# Patient Record
Sex: Male | Born: 1995 | Race: Black or African American | Hispanic: No | Marital: Single | Smoking: Never smoker
Health system: Southern US, Community
[De-identification: ages and names within clinical notes are randomized; demographics above are authoritative.]

## PROBLEM LIST (undated history)

## (undated) DIAGNOSIS — F99 Mental disorder, not otherwise specified: Secondary | ICD-10-CM

## (undated) DIAGNOSIS — R011 Cardiac murmur, unspecified: Secondary | ICD-10-CM

## (undated) DIAGNOSIS — J45909 Unspecified asthma, uncomplicated: Secondary | ICD-10-CM

## (undated) HISTORY — PX: TONSILLECTOMY: SUR1361

---
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
## 2013-01-12 ENCOUNTER — Ambulatory Visit: Payer: Medicaid Other | Admitting: Family Medicine

## 2013-01-29 ENCOUNTER — Ambulatory Visit (INDEPENDENT_AMBULATORY_CARE_PROVIDER_SITE_OTHER): Payer: Medicaid Other | Admitting: Family Medicine

## 2013-01-29 ENCOUNTER — Other Ambulatory Visit (HOSPITAL_COMMUNITY)
Admission: RE | Admit: 2013-01-29 | Discharge: 2013-01-29 | Disposition: A | Discharge status: Home / Self Care | Payer: Medicaid Other | Source: Ambulatory Visit | Attending: Family Medicine | Admitting: Family Medicine

## 2013-01-29 ENCOUNTER — Encounter: Payer: Self-pay | Admitting: Family Medicine

## 2013-01-29 VITALS — BP 128/87 | HR 74 | Temp 98.2°F | Ht 61.0 in | Wt 136.0 lb

## 2013-01-29 DIAGNOSIS — J45990 Exercise induced bronchospasm: Secondary | ICD-10-CM

## 2013-01-29 DIAGNOSIS — Z00129 Encounter for routine child health examination without abnormal findings: Secondary | ICD-10-CM

## 2013-01-29 DIAGNOSIS — Z0000 Encounter for general adult medical examination without abnormal findings: Secondary | ICD-10-CM

## 2013-01-29 DIAGNOSIS — Z113 Encounter for screening for infections with a predominantly sexual mode of transmission: Secondary | ICD-10-CM | POA: Insufficient documentation

## 2013-01-29 DIAGNOSIS — R079 Chest pain, unspecified: Secondary | ICD-10-CM

## 2013-01-29 DIAGNOSIS — H5213 Myopia, bilateral: Secondary | ICD-10-CM

## 2013-01-29 DIAGNOSIS — H5210 Myopia, unspecified eye: Secondary | ICD-10-CM

## 2013-01-29 DIAGNOSIS — H579 Unspecified disorder of eye and adnexa: Secondary | ICD-10-CM

## 2013-01-29 DIAGNOSIS — Z7251 High risk heterosexual behavior: Secondary | ICD-10-CM

## 2013-01-29 DIAGNOSIS — Z23 Encounter for immunization: Secondary | ICD-10-CM

## 2013-01-29 MED ORDER — ALBUTEROL SULFATE HFA 108 (90 BASE) MCG/ACT IN AERS: 2.0000 | INHALATION_SPRAY | Freq: Four times a day (QID) | RESPIRATORY_TRACT | Status: DC | PRN

## 2013-01-29 NOTE — Assessment & Plan Note (Signed)
Immunizations given today include MMR, Varicella, Hep A, HPV step 1.

## 2013-01-29 NOTE — Assessment & Plan Note (Signed)
Check urine GC, chlamydia and HIV antibody given age and risk factors for STI.

## 2013-01-29 NOTE — Assessment & Plan Note (Signed)
Given chest pain that is central and only with exercise in a patient with history of asthma, likely asthma related asthma. No signs of cardiac problems on physical. Restart albuterol PRN with exercise and reassess.

## 2013-01-29 NOTE — Patient Instructions (Signed)
Dear Jason Gregory,   Thank you for coming to clinic today. Please read below regarding the issues that we discussed.   1. Chest Pain - Use your inhaler as needed because this could be related to asthma.   2. Labs - I will call you with the results.   3. Vision - I will put in a referral for an eye doctor. You must go to the appointment.   Please follow up in clinic in 1 year. Please call earlier if you have any questions or concerns.   Sincerely,   Dr. Clinton Sawyer

## 2013-01-29 NOTE — Progress Notes (Signed)
Subjective:    Patient ID: Jason Gregory, male    DOB: 1996-06-27, 17 y.o.   MRN: [id number]  HPI  17 year old M presenting for well child check. Has concerns about chest pain.   Chest Pain - Centrally located, occurs whenever doing something active, relieved by rest, does not occur at rest, has history of asthma but has not used albuterol in > 1 year, denies wheezing   Home: lives with mother Jason Gregory, is also my patient), mother is supportive, also has 3 older siblings who live in [place] who he spends time with Education: Not in school, would like to go to college Activities: no organized activity Drugs/Alcohol: denies Sex: sexually active with one partner, uses condoms occasionally, has history of multiple partners with whom he did not use protection, does not want children at this time Suicide/Stress: describes his stress level as medium right now, stressed increased by legal troubles, stress relieved by spending time with his family, denies suicidal and homicidal thoughts    Past Medical History  Diagnosis Date  . Psychiatric diagnosis   . Asthma   . Heart murmur    Past Surgical History  Procedure Laterality Date  . Tonsillectomy     History   Social History  . Marital Status: Single    Spouse Name: N/A    Number of Children: N/A  . Years of Education: N/A   Occupational History  . Not on file.   Social History Main Topics  . Smoking status: Never Smoker   . Smokeless tobacco: Never Used  . Alcohol Use: No  . Drug Use: No  . Sexually Active: Not on file   Other Topics Concern  . Not on file   Social History Narrative  . No narrative on file     Current Outpatient Prescriptions on File Prior to Visit  Medication Sig Dispense Refill  . diclofenac (VOLTAREN) 75 MG EC tablet Take 1 tablet (75 mg total) by mouth 2 (two) times daily as needed (pain).  60 tablet  0  . meloxicam (MOBIC) 15 MG tablet Take 1 tablet (15 mg total) by mouth daily.  30  tablet  0  . [DISCONTINUED] albuterol (PROVENTIL,VENTOLIN) 90 MCG/ACT inhaler Inhale 2 puffs into the lungs every 4 (four) hours.         No current facility-administered medications on file prior to visit.       Review of Systems  Constitutional: Positive for appetite change. Negative for activity change and unexpected weight change.  HENT: Negative.   Eyes: Positive for visual disturbance.  Respiratory: Positive for chest tightness and shortness of breath.   Cardiovascular: Negative.   Gastrointestinal: Negative.   Endocrine: Negative.   Genitourinary: Negative.   Musculoskeletal: Negative.   Skin: Negative.   Neurological: Negative.   Psychiatric/Behavioral: Negative for suicidal ideas. The patient is nervous/anxious.        Objective:   Physical Exam  Constitutional: He is oriented to person, place, and time. He appears well-developed and well-nourished. No distress.  HENT:  Head: Normocephalic and atraumatic.  Eyes: Pupils are equal, round, and reactive to light.  Neck: Normal range of motion. Neck supple.  Cardiovascular: Normal rate and regular rhythm.   No murmur heard. Pulmonary/Chest: Effort normal and breath sounds normal.  Abdominal: Soft. Bowel sounds are normal.  Musculoskeletal: He exhibits no edema and no tenderness.  Police tracking bracelet on left ankle   Neurological: He is alert and oriented to person, place, and time.  Skin: He is not diaphoretic.  Psychiatric: He has a normal mood and affect.    BP 128/87  Pulse 74  Temp(Src) 98.2 F (36.8 C) (Oral)  Ht 5\' 1"  (1.549 m)  Wt 136 lb (61.689 kg)  BMI 25.71 kg/m2       Assessment & Plan:  Wellness visit and asthma follow up.

## 2013-01-29 NOTE — Assessment & Plan Note (Signed)
Referral made to optometry for evaluation.

## 2013-01-30 ENCOUNTER — Telehealth: Payer: Self-pay | Admitting: Family Medicine

## 2013-01-30 ENCOUNTER — Other Ambulatory Visit: Payer: Self-pay | Admitting: Family Medicine

## 2013-01-30 NOTE — Telephone Encounter (Signed)
Called to discuss lab results. No answer. No message left.

## 2013-01-31 ENCOUNTER — Telehealth: Payer: Self-pay | Admitting: Family Medicine

## 2013-01-31 NOTE — Telephone Encounter (Signed)
Please call the patient and let him know that he tested positive for chlamydia. He needs to come to clinic for a nursing visit. He should also be inform his partner that she needs treatment. Preferred treatment is azithromycin 1 g PO once.

## 2013-01-31 NOTE — Telephone Encounter (Signed)
LMOM advising pt to rt our call.

## 2013-02-01 NOTE — Telephone Encounter (Signed)
Left another message advising patient to return call today. He needs a nurse visit for positive chlamydia test.Jason Gregory, Jason Gregory

## 2013-02-06 ENCOUNTER — Telehealth: Payer: Self-pay | Admitting: *Deleted

## 2013-02-06 ENCOUNTER — Encounter: Payer: Self-pay | Admitting: Family Medicine

## 2013-02-06 DIAGNOSIS — A749 Chlamydial infection, unspecified: Secondary | ICD-10-CM | POA: Insufficient documentation

## 2013-02-06 NOTE — Telephone Encounter (Signed)
Called pt again. LMVM to return call. Pt never called back. Re: positive chlamydia. Pt was never treated. See previous messages. I have sent the report to the [facility] Department. Lorenda Hatchet, Renato Battles

## 2013-02-07 ENCOUNTER — Encounter: Payer: Self-pay | Admitting: Family Medicine

## 2013-02-07 NOTE — Telephone Encounter (Signed)
I sent a letter verifying the infection and stating that he and his partner need to come to [facility] for treatment.

## 2013-02-13 ENCOUNTER — Ambulatory Visit (INDEPENDENT_AMBULATORY_CARE_PROVIDER_SITE_OTHER): Payer: Medicaid Other | Admitting: *Deleted

## 2013-02-13 DIAGNOSIS — A749 Chlamydial infection, unspecified: Secondary | ICD-10-CM

## 2013-02-13 MED ORDER — AZITHROMYCIN 500 MG PO TABS
1000.0000 mg | ORAL_TABLET | Freq: Once | ORAL | Status: AC
  Administered 2013-02-13: 1000 mg via ORAL

## 2013-02-13 NOTE — Progress Notes (Signed)
Pt here today for treatment of  Chlamydia . Pt given Agricultural engineer and condoms. Informed that pt should not have intercourse for 7 days after tx and that partner needs treatment as well. Pt verbalized understanding. Given 1 g azithromycin  prescribed by Dr. Clinton Sawyer. Communicable Disease Report faxed to Health Dept.  Wyatt Haste, RN-BSN

## 2013-03-25 ENCOUNTER — Emergency Department (HOSPITAL_COMMUNITY)
Admission: EM | Admit: 2013-03-25 | Discharge: 2013-03-25 | Disposition: A | Discharge status: Home / Self Care | Payer: Medicaid Other | Attending: Emergency Medicine | Admitting: Emergency Medicine

## 2013-03-25 ENCOUNTER — Encounter (HOSPITAL_COMMUNITY): Payer: Self-pay | Admitting: *Deleted

## 2013-03-25 DIAGNOSIS — Z9089 Acquired absence of other organs: Secondary | ICD-10-CM | POA: Insufficient documentation

## 2013-03-25 DIAGNOSIS — R599 Enlarged lymph nodes, unspecified: Secondary | ICD-10-CM | POA: Insufficient documentation

## 2013-03-25 DIAGNOSIS — J45909 Unspecified asthma, uncomplicated: Secondary | ICD-10-CM | POA: Insufficient documentation

## 2013-03-25 DIAGNOSIS — R011 Cardiac murmur, unspecified: Secondary | ICD-10-CM | POA: Insufficient documentation

## 2013-03-25 DIAGNOSIS — J069 Acute upper respiratory infection, unspecified: Secondary | ICD-10-CM | POA: Insufficient documentation

## 2013-03-25 DIAGNOSIS — Z791 Long term (current) use of non-steroidal anti-inflammatories (NSAID): Secondary | ICD-10-CM | POA: Insufficient documentation

## 2013-03-25 DIAGNOSIS — Z79899 Other long term (current) drug therapy: Secondary | ICD-10-CM | POA: Insufficient documentation

## 2013-03-25 DIAGNOSIS — J029 Acute pharyngitis, unspecified: Secondary | ICD-10-CM | POA: Insufficient documentation

## 2013-03-25 DIAGNOSIS — Z8659 Personal history of other mental and behavioral disorders: Secondary | ICD-10-CM | POA: Insufficient documentation

## 2013-03-25 LAB — RAPID STREP SCREEN (MED CTR MEBANE ONLY): Streptococcus, Group A Screen (Direct): NEGATIVE

## 2013-03-25 MED ORDER — IBUPROFEN 800 MG PO TABS: 800.0000 mg | ORAL_TABLET | Freq: Three times a day (TID) | ORAL | Status: DC | PRN

## 2013-03-25 MED ORDER — HYDROCODONE-ACETAMINOPHEN 7.5-325 MG/15ML PO SOLN: ORAL | Status: DC

## 2013-03-25 MED ORDER — HYDROCODONE-ACETAMINOPHEN 7.5-325 MG/15ML PO SOLN
10.0000 mL | ORAL | Status: AC
  Administered 2013-03-25: 10 mL via ORAL
  Filled 2013-03-25: qty 15

## 2013-03-25 NOTE — ED Provider Notes (Signed)
  CSN: [id number]     Arrival date & time 03/25/13  4010 History     First MD Initiated Contact with Patient 03/25/13 1006     Chief Complaint  Patient presents with  . Sore Throat   (Consider location/radiation/quality/duration/timing/severity/associated sxs/prior Treatment) HPI Comments: Patient reports he has had a sore throat for two days, feels like he is "swallowing rocks." Has taken over the counter sore throat medication with no improvement.  Denies fevers, chills, body aches, nasal congestion, ear pain, cough, SOB.  Sick contact: mother with URI.    Patient is a 17 y.o. male presenting with pharyngitis. The history is provided by the patient.  Sore Throat Associated symptoms include a sore throat. Pertinent negatives include no chills, congestion, coughing or fever.    Past Medical History  Diagnosis Date  . Psychiatric diagnosis   . Asthma   . Heart murmur    Past Surgical History  Procedure Laterality Date  . Tonsillectomy     No family history on file. History  Substance Use Topics  . Smoking status: Never Smoker   . Smokeless tobacco: Never Used  . Alcohol Use: No    Review of Systems  Constitutional: Negative for fever and chills.  HENT: Positive for sore throat. Negative for ear pain, congestion, drooling, trouble swallowing and neck stiffness.   Respiratory: Negative for cough and shortness of breath.     Allergies  Review of patient's allergies indicates no known allergies.  Home Medications   Current Outpatient Rx  Name  Route  Sig  Dispense  Refill  . albuterol (PROVENTIL HFA;VENTOLIN HFA) 108 (90 BASE) MCG/ACT inhaler   Inhalation   Inhale 2 puffs into the lungs every 6 (six) hours as needed for wheezing.   1 Inhaler   2   . meloxicam (MOBIC) 15 MG tablet   Oral   Take 15 mg by mouth daily.          BP 129/85  Pulse 101  Temp(Src) 99.3 F (37.4 C) (Oral)  Resp 18  SpO2 98% Physical Exam  Nursing note and vitals  reviewed. Constitutional: He appears well-developed and well-nourished. No distress.  HENT:  Head: Normocephalic and atraumatic.  Nose: Nose normal.  Mouth/Throat: Uvula is midline. Mucous membranes are not dry. No edematous. Posterior oropharyngeal edema and posterior oropharyngeal erythema present. No tonsillar abscesses.  Neck: Normal range of motion. Neck supple.  Cardiovascular: Normal rate and regular rhythm.   Pulmonary/Chest: Effort normal and breath sounds normal. No stridor. No respiratory distress. He has no wheezes. He has no rales.  Lymphadenopathy:    He has cervical adenopathy.  Neurological: He is alert.  Skin: He is not diaphoretic.    ED Course   Procedures (including critical care time)  Labs Reviewed  RAPID STREP SCREEN  CULTURE, GROUP A STREP   No results found. 1. Pharyngitis     MDM  Pt with two days of sore throat.  Strep screen negative.  +sick contacts at home with URI.  Likely viral pharyngitis. Culture pending.  Pt given lortab elixir in ED and d/c home with same plus ibuprofen.  No airway concerns or clinical e/o peritonsillar abscess.  Discussed all results with patient.  Pt given return precautions.  Pt verbalizes understanding and agrees with plan.     I doubt any other EMC precluding discharge at this time including, but not necessarily limited to the following: deep space neck infection or peritonsillar abscess  Trixie Dredge, PA-C 03/25/13 1322

## 2013-03-25 NOTE — ED Notes (Signed)
PT is here with sore throat for 2 days and feels like he is swallowing rocks.  NO fever or headache

## 2013-03-26 NOTE — ED Provider Notes (Signed)
History/physical exam/procedure(s) were performed by non-physician practitioner and as supervising physician I was immediately available for consultation/collaboration. I have reviewed all notes and am in agreement with care and plan.   Clydine Parkison S November Sypher, MD 03/26/13 1232 

## 2013-03-27 LAB — CULTURE, GROUP A STREP

## 2013-03-30 ENCOUNTER — Ambulatory Visit: Payer: Medicaid Other

## 2013-05-18 ENCOUNTER — Ambulatory Visit: Payer: Medicaid Other

## 2014-06-05 ENCOUNTER — Encounter: Payer: Self-pay | Admitting: Family Medicine

## 2014-06-05 ENCOUNTER — Ambulatory Visit (INDEPENDENT_AMBULATORY_CARE_PROVIDER_SITE_OTHER): Payer: Medicaid Other

## 2014-06-05 ENCOUNTER — Ambulatory Visit (INDEPENDENT_AMBULATORY_CARE_PROVIDER_SITE_OTHER): Payer: Medicaid Other | Admitting: Family Medicine

## 2014-06-05 VITALS — BP 129/79 | HR 76 | Temp 98.5°F | Ht 60.0 in | Wt 132.0 lb

## 2014-06-05 DIAGNOSIS — Z23 Encounter for immunization: Secondary | ICD-10-CM

## 2014-06-05 DIAGNOSIS — F69 Unspecified disorder of adult personality and behavior: Secondary | ICD-10-CM

## 2014-06-05 DIAGNOSIS — J45990 Exercise induced bronchospasm: Secondary | ICD-10-CM

## 2014-06-05 DIAGNOSIS — IMO0002 Reserved for concepts with insufficient information to code with codable children: Secondary | ICD-10-CM

## 2014-06-05 MED ORDER — ALBUTEROL SULFATE HFA 108 (90 BASE) MCG/ACT IN AERS: 2.0000 | INHALATION_SPRAY | Freq: Four times a day (QID) | RESPIRATORY_TRACT | Status: AC | PRN

## 2014-06-05 NOTE — Patient Instructions (Signed)
Thank you for coming to see me today. It was a pleasure. Today we talked about:   Asthma: I am refilling your albuterol. Please use this 30 minutes BEFORE you plan to do strenuous  physical activity.  Depressed mood: I want you to see a psychiatrist. Please call [facility]: [phone].  Please make an appointment to see me in 2-3 weeks for follow-up.  If you have any questions or concerns, please do not hesitate to call the office at [phone].  Sincerely,  Jacquelin Hawkingalph Emery Dupuy, MD

## 2014-06-05 NOTE — Progress Notes (Signed)
    Subjective    Jason Gregory is a 18 y.o. male that presents for an office visit.   1. Asthma: Uses albuterol every other day. Uses when he gets short winded. Symptoms worse with running but also feels "short winded" in the mornings (but not overnight). No night time coughing. Albuterol last used two weeks ago.  2. Behavior: States he has trouble focusing and gets mad quickly. Also has symptoms of depressed feelings. States when he is feeling happy, it lasts for hours. Feelings of sadness more than happiness. This has been going on for years. He used to be on Ritalin for "trouble focusing.)  History  Substance Use Topics  . Smoking status: Never Smoker   . Smokeless tobacco: Never Used  . Alcohol Use: No    No Known Allergies  No orders of the defined types were placed in this encounter.    ROS  Per HPI   Objective   There were no vitals taken for this visit.  General: Fair appearing, no distress Respiratory/Chest: Clear to auscultation, bilaterally. No wheezing Psych: Flat affect  Assessment and Plan   Please refer to problem based charting of assessment and plan

## 2014-06-17 DIAGNOSIS — J45990 Exercise induced bronchospasm: Secondary | ICD-10-CM | POA: Insufficient documentation

## 2014-06-17 NOTE — Assessment & Plan Note (Signed)
Symptoms are not persistent. Appear to be exacerbated by activity. Will refill albuterol.

## 2014-06-17 NOTE — Assessment & Plan Note (Signed)
Patient with reported history of ADHD, bipolar and depression. Patient currently without SI. Will have patietn follow-up with psychiatry.

## 2014-11-22 ENCOUNTER — Ambulatory Visit: Payer: Self-pay | Admitting: Family Medicine

## 2014-12-16 ENCOUNTER — Encounter: Payer: Self-pay | Admitting: Psychology

## 2014-12-16 ENCOUNTER — Ambulatory Visit (INDEPENDENT_AMBULATORY_CARE_PROVIDER_SITE_OTHER): Payer: Medicaid Other | Admitting: Family Medicine

## 2014-12-16 ENCOUNTER — Other Ambulatory Visit (HOSPITAL_COMMUNITY)
Admission: RE | Admit: 2014-12-16 | Discharge: 2014-12-16 | Disposition: A | Discharge status: Home / Self Care | Payer: Medicaid Other | Source: Ambulatory Visit | Attending: Family Medicine | Admitting: Family Medicine

## 2014-12-16 ENCOUNTER — Encounter: Payer: Self-pay | Admitting: Family Medicine

## 2014-12-16 VITALS — BP 124/79 | HR 71 | Temp 97.8°F | Ht 59.0 in | Wt 132.0 lb

## 2014-12-16 DIAGNOSIS — G4700 Insomnia, unspecified: Secondary | ICD-10-CM | POA: Insufficient documentation

## 2014-12-16 DIAGNOSIS — Z113 Encounter for screening for infections with a predominantly sexual mode of transmission: Secondary | ICD-10-CM | POA: Diagnosis not present

## 2014-12-16 DIAGNOSIS — A64 Unspecified sexually transmitted disease: Secondary | ICD-10-CM | POA: Insufficient documentation

## 2014-12-16 DIAGNOSIS — R300 Dysuria: Secondary | ICD-10-CM

## 2014-12-16 MED ORDER — CEFTRIAXONE SODIUM 1 G IJ SOLR
250.0000 mg | Freq: Once | INTRAMUSCULAR | Status: AC
  Administered 2014-12-16: 250 mg via INTRAMUSCULAR

## 2014-12-16 MED ORDER — AZITHROMYCIN 500 MG PO TABS
1000.0000 mg | ORAL_TABLET | Freq: Once | ORAL | Status: AC
  Administered 2014-12-16: 1000 mg via ORAL

## 2014-12-16 NOTE — Addendum Note (Signed)
Addended by: Clovis PuMARTIN, Rheba Diamond L on: 12/16/2014 12:10 PM   Modules accepted: Orders

## 2014-12-16 NOTE — Assessment & Plan Note (Addendum)
New problem. Patient history concern for STD. Urine GC/Chlamydia obtained today. Given prior history (and poor follow up) patient was treated with Rocephin and azithromycin today.

## 2014-12-16 NOTE — Progress Notes (Signed)
I was preceptor the day of this visit.   

## 2014-12-16 NOTE — Progress Notes (Signed)
Dr. Lacinda Axon consulted me on this patient for resources.  I met with the patient briefly to help determine what might be the best match.  See Dr. Jonathon Jordan history for further assessment.  Presenting Issue: Patient reports insomnia but has a history of mental health issues that could be explanatory or contributory.    Report of symptoms: Patient reports difficulty getting to sleep often not falling asleep until 4:00 - 6:00 am in the morning and sleeping about 2-3 hours.  He reports taking a 30 minute to hour nap once a day.  He reports feeling tired the whole day.  Also reports difficulty with mood swings - mostly anger but some sadness.  Eats but does not have much of an appetite.  Denies suicidal ideation.  Negative for anhedonia.  Positive for fatigue / energy.    Age of onset of first mood disturbance:  Had "behavior issues" since middle school and was eventually kicked out secondary to fighting a Pharmacist, hospital.   Duration of CURRENT symptoms:  For the insomnia, he reports was given "sleeping pills" while hospitalized in 2012.    Impact on function:  He is attending [employer] right now to get his GED.  He denies falling asleep at school but reports feeling tired.  Plans to work with his Dad over the summer.    Psychiatric History - Diagnoses:  Reports he has been diagnosed with Bipolar Disorder and ADHD.  Denies a history of ODD or Conduct Disorder. - Hospitalizations:  Patient reports that in 2012 he had a seven-day stay at [facility] because his mother thought he was suicidal.  He stated he was not trying to hurt himself but was having difficulty controlling his anger. - Pharmacotherapy:  Ritalin for ADHD while in school.  Other meds that he can not remember the names.   - Outpatient therapy:  Mentioned [facility] where he said they diagnosed him with Bipolar Disorder.  Family history of psychiatric issues: States mother has mental health issues (not sure what) and that his father and  siblings (three of them) are "crazy."  Reports they are all like "firecrackers."  No specific diagnoses that he knows.  Current and history of substance use: Denies significant caffeine intake.  He reports smoking THC about once a week.  Denies the use of other drugs.    Assessment / Recommendations: With just a brief history, it seems likely that insomnia may be tired to a larger issue either mood or behavioral.  Patient reports interest in learning how to better manage his anger and also help with his insomnia.  He appeared receptive to a referral to [facility].  I wrote a letter for him to take with him to an appointment there so that the clinic has some background information.  Will follow up by phone in one week to see if he got connected with [facility].

## 2014-12-16 NOTE — Assessment & Plan Note (Signed)
Integrative care model use today. He has significant underlying mental illness (he reported bipolar disorder to Dr. Kane today). Patient met with Dr. Kane who advised use of [facility] clinic. No medication was prescribed today given psychiatric history.  I did give patient a handout regarding good sleep hygiene.  Patient to follow up with [facility] psychology clinic and PCP.  

## 2014-12-16 NOTE — Patient Instructions (Addendum)
Basic rules for a good night's sleep Sleep only as much as you need to feel rested and then get out of bed Keep a regular sleep schedule Avoid forcing sleep Exercise regularly for at least 20 minutes, preferably 4 to 5 hours before bedtime Avoid caffeinated beverages after lunch Avoid alcohol near bedtime: no "night cap" Avoid smoking, especially in the evening Do not go to bed hungry Adjust bedroom environment Avoid prolonged use of light-emitting screens before bedtime  Deal with your worries before bedtime  [facility] evaluation and treatment.   Follow up with your PCP as indicated.

## 2014-12-16 NOTE — Progress Notes (Signed)
   Subjective:    Patient ID: Jason Gregory, male    DOB: 1996-01-11, 19 y.o.   MRN: [id number]  HPI 19 year old male with a PMH of mental/behavioral problem (unclear in EMR, he reports he was [facility] in 2012) presents for a same day appointment with complaints of insomnia.   1) Insomnia  Patient reports that he is been battling insomnia for approximately 1-1.5 years.  He states he has trouble falling asleep. He states he often lays awake until 4-6 in the morning.   He reports recent stressors surrounding his girlfriend.  When asked about sleep hygiene, patient states that he states that he watches TV in bed.  No interventions tried. No relieving factors.   2) Dysuria, concern for STD  Patient reports a one-week history of dysuria.  Patient reports recent sexual intercourse. He states that he does not always practice safe sex.  Review of the EMR reveals that he has high risk sexual behavior and is also had a prior untreated STD.  No reports of drainage from the urethral meatus.  No recent fever. No testicular pain.  No relieving factors. No treatments tried.   Social Hx - Nonsmoker.   Review of Systems  Constitutional: Negative.   Genitourinary: Positive for dysuria. Negative for discharge and testicular pain.  Psychiatric/Behavioral: Positive for sleep disturbance.      Objective:   Physical Exam Filed Vitals:   12/16/14 0917  BP: 124/79  Pulse: 71  Temp: 97.8 F (36.6 C)   Vital signs reviewed.  Exam: General: well appearing, NAD. GU: Normal genitalia. No testicular tenderness. No lesions noted. Psych: Depressed mood and flat affect noted.     Assessment & Plan:  See Problem List

## 2014-12-17 ENCOUNTER — Encounter: Payer: Self-pay | Admitting: Family Medicine

## 2014-12-17 LAB — URINE CYTOLOGY ANCILLARY ONLY
Chlamydia: NEGATIVE
Neisseria Gonorrhea: NEGATIVE

## 2015-01-02 ENCOUNTER — Telehealth: Payer: Self-pay | Admitting: Family Medicine

## 2015-01-02 NOTE — Telephone Encounter (Signed)
Mr. Jason Gregory was seen on 12/16/14 as a Set designerBehavioral Health Consult with Dr. Pascal LuxKane for his insomnia and anger. [facility] and a follow up phone call was to be made to him within a week to ensure the success of this appointment. I attempted to make this call to determine whether or not his follow up instructions from his last visit was successful. I left a message for him to return my call with his mother at his home # and I left a voice message on his mobile # to return call to follow this up.   Dr. Pascal LuxKane - This is updated in the [facility]  Sadie Reynolds, ASA

## 2015-01-07 ENCOUNTER — Telehealth: Payer: Self-pay | Admitting: Family Medicine

## 2015-01-07 NOTE — Telephone Encounter (Signed)
Meant to addend last message, closing encounter, Jason BasemanSadie Gregory, ASA

## 2015-01-07 NOTE — Telephone Encounter (Signed)
I was able to get into contact with Mr. Jason Gregory today. He explained that he had missed his [facility]. He had been meaning to reschedule it but he had forgotten. I asked if he still had the information to contact [facility], and he said that he did and ensured me that he would be making the appointment again soon. Might I suggest a follow up phone call from Dr. Pascal LuxKane in about a week to see if he was able to do so? He seemed a little surprised by my phone call but still very respectful and accepting. Please let me know so that I can update the registry, as this information has been put in. Thanks! Sadie Reynolds, ASA

## 2015-01-07 NOTE — Telephone Encounter (Signed)
He does not appear to have a follow-up appointment scheduled with his PCP.  I would recommend a follow-up phone call in one month to determine whether he got connected with [facility].  I can make this call because if he hasn't gotten connected with [facility],  I will assess whether sleep and anger management are still issues and try to get a sense of what he would like to do about them.  Thanks Sadie.

## 2015-02-17 ENCOUNTER — Telehealth: Payer: Self-pay | Admitting: Psychology

## 2015-02-17 NOTE — Telephone Encounter (Signed)
I called Mr. Jason Gregory to see if he got connected with [facility] and to determine the degree to which insomnia and anger were affecting his function.  A woman answered the phone and I asked her to have him call me at his earliest convenience.  If I do not hear from him, I will consider this closed unless he requests follow-up.

## 2015-02-18 NOTE — Telephone Encounter (Signed)
Spoke with patient.  He has not followed up with [facility].  He thinks insomnia and anger are still an issue.  He says he is interested in following up with [facility].  I gave him the contact number again.  Will await follow-up from him or through his physician when he comes back to clinic.

## 2016-08-12 ENCOUNTER — Encounter: Payer: Medicaid Other | Admitting: Family Medicine

## 2016-12-22 ENCOUNTER — Ambulatory Visit: Payer: Medicaid Other | Admitting: Internal Medicine

## 2017-11-10 ENCOUNTER — Other Ambulatory Visit: Payer: Self-pay

## 2017-11-10 ENCOUNTER — Encounter (HOSPITAL_COMMUNITY): Payer: Self-pay | Admitting: *Deleted

## 2017-11-10 ENCOUNTER — Emergency Department (HOSPITAL_COMMUNITY)
Admission: EM | Admit: 2017-11-10 | Discharge: 2017-11-10 | Disposition: A | Discharge status: Home / Self Care | Payer: Self-pay | Attending: Family Medicine | Admitting: Family Medicine

## 2017-11-10 DIAGNOSIS — J45909 Unspecified asthma, uncomplicated: Secondary | ICD-10-CM | POA: Insufficient documentation

## 2017-11-10 DIAGNOSIS — R51 Headache: Secondary | ICD-10-CM | POA: Insufficient documentation

## 2017-11-10 DIAGNOSIS — R519 Headache, unspecified: Secondary | ICD-10-CM

## 2017-11-10 MED ORDER — DIPHENHYDRAMINE HCL 50 MG/ML IJ SOLN
25.0000 mg | Freq: Once | INTRAMUSCULAR | Status: AC
  Administered 2017-11-10: 25 mg via INTRAVENOUS
  Filled 2017-11-10: qty 1

## 2017-11-10 MED ORDER — METOCLOPRAMIDE HCL 5 MG/ML IJ SOLN
10.0000 mg | Freq: Once | INTRAMUSCULAR | Status: AC
  Administered 2017-11-10: 10 mg via INTRAVENOUS
  Filled 2017-11-10: qty 2

## 2017-11-10 MED ORDER — KETOROLAC TROMETHAMINE 30 MG/ML IJ SOLN
30.0000 mg | Freq: Once | INTRAMUSCULAR | Status: AC
  Administered 2017-11-10: 30 mg via INTRAVENOUS
  Filled 2017-11-10: qty 1

## 2017-11-10 MED ORDER — SODIUM CHLORIDE 0.9 % IV BOLUS
1000.0000 mL | Freq: Once | INTRAVENOUS | Status: AC
  Administered 2017-11-10: 1000 mL via INTRAVENOUS

## 2017-11-10 NOTE — ED Provider Notes (Signed)
[facility] EMERGENCY DEPARTMENT Provider Note   CSN: [id number] Arrival date & time: 11/10/17  4098     History   Chief Complaint Chief Complaint  Patient presents with  . Headache    HPI Jason Gregory is a 22 y.o. male past medical history of asthma, psychiatric diagnoses who presents for evaluation of generalized headache that began yesterday.  Patient reports that headache was gradual in nature and denies any thunderclap headache.  He denies any preceding trauma, injury, fall.  Patient reports that he has had a history of the same headaches before and states that today's headache feels similar.  He describes it as a generalized "throbbing."  Patient reports that he usually takes Tylenol for these headaches but states he did not take any yesterday.  Patient reports that headache is improved with laying down and with resting.  He reports it is worsened when he gets up and stands.  It is not worse with exertion or affected by position.  Patient denies any fever, neck pain, visual changes, photophobia, chest pain, nausea/vomiting difficulty breathing, numbness/weakness of his arms or legs.  The history is provided by the patient.    Past Medical History:  Diagnosis Date  . Asthma   . Heart murmur   . Psychiatric diagnosis     Patient Active Problem List   Diagnosis Date Noted  . STD (male) 12/16/2014  . Insomnia 12/16/2014  . Exercise-induced asthma 06/17/2014  . Chlamydia infection 02/06/2013  . High risk sexual behavior 01/29/2013  . Preventative health care 01/29/2013  . Shoulder pain 06/21/2012  . Myopia of both eyes 04/23/2011  . Mental or behavioral problem 11/01/2007    Past Surgical History:  Procedure Laterality Date  . TONSILLECTOMY          Home Medications    Prior to Admission medications   Medication Sig Start Date End Date Taking? Authorizing Provider  albuterol (PROVENTIL HFA;VENTOLIN HFA) 108 (90 BASE) MCG/ACT inhaler Inhale 2  puffs into the lungs every 6 (six) hours as needed for wheezing. Patient not taking: Reported on 11/10/2017 06/05/14   Narda Bonds, MD  albuterol (PROVENTIL,VENTOLIN) 90 MCG/ACT inhaler Inhale 2 puffs into the lungs every 4 (four) hours.    04/23/11  [provider]    Family History No family history on file.  Social History Social History   Tobacco Use  . Smoking status: Never Smoker  . Smokeless tobacco: Never Used  Substance Use Topics  . Alcohol use: No  . Drug use: No     Allergies   Patient has no known allergies.   Review of Systems Review of Systems  Constitutional: Negative for fever.  Eyes: Negative for photophobia and visual disturbance.  Respiratory: Negative for cough and shortness of breath.   Cardiovascular: Negative for chest pain.  Gastrointestinal: Negative for abdominal pain, diarrhea, nausea and vomiting.  Musculoskeletal: Negative for back pain and neck pain.  Skin: Negative for rash.  Neurological: Positive for headaches. Negative for weakness and numbness.  All other systems reviewed and are negative.    Physical Exam Updated Vital Signs BP 126/73   Pulse 90   Temp 98 F (36.7 C) (Oral)   Resp 16   SpO2 99%   Physical Exam  Constitutional: He is oriented to person, place, and time. He appears well-developed and well-nourished.  HENT:  Head: Normocephalic and atraumatic.  Mouth/Throat: Oropharynx is clear and moist and mucous membranes are normal.  No tenderness to palpation of  skull. No deformities or crepitus noted. No open wounds, abrasions or lacerations.   Eyes: Pupils are equal, round, and reactive to light. Conjunctivae, EOM and lids are normal.  Neck: Full passive range of motion without pain. Neck supple. No neck rigidity.  Full flexion/extension and lateral movement of neck fully intact. No bony midline tenderness. No deformities or crepitus.   Cardiovascular: Normal rate, regular rhythm, normal heart sounds and normal  pulses. Exam reveals no gallop and no friction rub.  No murmur heard. Pulmonary/Chest: Effort normal and breath sounds normal.  Abdominal: Soft. Normal appearance. There is no tenderness. There is no rigidity and no guarding.  Musculoskeletal: Normal range of motion.  Neurological: He is alert and oriented to person, place, and time.  Cranial nerves III-XII intact Follows commands, Moves all extremities  5/5 strength to BUE and BLE  Sensation intact throughout all major nerve distributions Normal finger to nose. No dysdiadochokinesia. No pronator drift. No gait abnormalities  No slurred speech. No facial droop.   Skin: Skin is warm and dry. Capillary refill takes less than 2 seconds.  Psychiatric: He has a normal mood and affect. His speech is normal.  Nursing note and vitals reviewed.    ED Treatments / Results  Labs (all labs ordered are listed, but only abnormal results are displayed) Labs Reviewed - No data to display  EKG None  Radiology No results found.  Procedures Procedures (including critical care time)  Medications Ordered in ED Medications  sodium chloride 0.9 % bolus 1,000 mL (0 mLs Intravenous Stopped 11/10/17 1200)  ketorolac (TORADOL) 30 MG/ML injection 30 mg (30 mg Intravenous Given 11/10/17 1030)  metoCLOPramide (REGLAN) injection 10 mg (10 mg Intravenous Given 11/10/17 1031)  diphenhydrAMINE (BENADRYL) injection 25 mg (25 mg Intravenous Given 11/10/17 1029)     Initial Impression / Assessment and Plan / ED Course  I have reviewed the triage vital signs and the nursing notes.  Pertinent labs & imaging results that were available during my care of the patient were reviewed by me and considered in my medical decision making (see chart for details).     22 year old male who presents for evaluation of headache that began yesterday.  No preceding trauma, injury, fall.  Reports he has a history of headaches and states todays headaches feel similar to  previous.  No associated vision changes, numbness/weakness, photophobia, nausea/vomiting. Patient is afebrile, non-toxic appearing, sitting comfortably on examination table. Vital signs reviewed and stable. No neuro deficits noted on exam. Consider tension headache vs migraine. History/physical exam is not concerning for CVA, ICH, cavernous venous thrombosis. History/physical exam is not concerning for meningitis. Plan for IVF, analgesics and observation in the ED.  Re-evaluation.  Patient reports headache has resolved completely.  We will plan to p.o. challenge patient department.  Patient able to tolerate p.o. without any difficulty.  No vomiting, nausea.  Patient states headache has completely gone away.  He is ambulating in the department without any difficulty.  Vital signs stable.  Patient stable for discharge.  Instructed with primary care follow-up. Patient had ample opportunity for questions and discussion. All patient's questions were answered with full understanding. Strict return precautions discussed. Patient expresses understanding and agreement to plan.     Final Clinical Impressions(s) / ED Diagnoses   Final diagnoses:  Acute nonintractable headache, unspecified headache type    ED Discharge Orders    None       Maxwell CaulLayden, Allysha Tryon A, PA-C 11/10/17 1248    Messick, Noralyn PickPeter C,  MD 11/10/17 1607

## 2017-11-10 NOTE — ED Notes (Signed)
No answer from pt in lobby 

## 2017-11-10 NOTE — Discharge Instructions (Signed)
You can take Tylenol or Ibuprofen as directed for pain. You can alternate Tylenol and Ibuprofen every 4 hours. If you take Tylenol at 1pm, then you can take Ibuprofen at 5pm. Then you can take Tylenol again at 9pm.   Follow-up with your primary care doctor or the referred [facility] for further evaluation.  Return to the Emergency Department for any worsening headache, vision changes, numbness/weakness of your arms or legs, difficulty walking or any other worsening or concerning symptoms.

## 2017-11-10 NOTE — ED Triage Notes (Signed)
Pt reports headache since yesterday. Pt states that he has had migraines in the past. No neuro deficits noted in triage.

## 2018-07-07 ENCOUNTER — Encounter (HOSPITAL_COMMUNITY): Payer: Self-pay | Admitting: Emergency Medicine

## 2018-07-07 ENCOUNTER — Emergency Department (HOSPITAL_COMMUNITY)
Admission: EM | Admit: 2018-07-07 | Discharge: 2018-07-08 | Disposition: A | Discharge status: Home / Self Care | Payer: Medicaid Other | Attending: Emergency Medicine | Admitting: Emergency Medicine

## 2018-07-07 DIAGNOSIS — Y929 Unspecified place or not applicable: Secondary | ICD-10-CM | POA: Insufficient documentation

## 2018-07-07 DIAGNOSIS — Y998 Other external cause status: Secondary | ICD-10-CM | POA: Insufficient documentation

## 2018-07-07 DIAGNOSIS — Y9389 Activity, other specified: Secondary | ICD-10-CM | POA: Insufficient documentation

## 2018-07-07 DIAGNOSIS — J45909 Unspecified asthma, uncomplicated: Secondary | ICD-10-CM | POA: Insufficient documentation

## 2018-07-07 DIAGNOSIS — W25XXXA Contact with sharp glass, initial encounter: Secondary | ICD-10-CM | POA: Insufficient documentation

## 2018-07-07 DIAGNOSIS — S61412A Laceration without foreign body of left hand, initial encounter: Secondary | ICD-10-CM

## 2018-07-07 NOTE — ED Triage Notes (Signed)
Patient arrive with EMS from home accidentally pushed on glass door this evening and sustained skin laceration approx. 1 1/2 " at left palm , dressing applied by EMS .

## 2018-07-08 MED ORDER — LIDOCAINE HCL (PF) 1 % IJ SOLN
10.0000 mL | Freq: Once | INTRAMUSCULAR | Status: AC
  Administered 2018-07-08: 10 mL via INTRADERMAL
  Filled 2018-07-08: qty 10

## 2018-07-08 NOTE — Discharge Instructions (Signed)
You will need to have your sutures removed in 7 to 10 days.  You may follow-up with your primary care physician, urgent care or return to this emergency department for suture removal.  You may clean this wound gently with warm soap and water.  You may use Neosporin twice a day on this wound.  You may alternate Tylenol 1000 mg every 6 hours as needed for pain and Ibuprofen 800 mg every 8 hours as needed for pain.  Please take Ibuprofen with food.    Steps to find a Primary Care Provider (PCP):  Call [phone] or [phone] to access "[facility] Find a Doctor Service."  2.  You may also go on the [facility] website at [email or web address]  3.  [facility] also frequently accepts new patients.  [facility]  [address] [place] [id number] [phone]  4.  There are also multiple [facility], [facility], [place] and [facility] practices throughout the [place] that are frequently accepting new patients. You may find a clinic that is close to your home and contact them.  [facility] [email or web address] [phone]  [email or web address]  [email or web address] [phone]  [email or web address] [phone]  5.  Local Health Departments also can provide primary care services.  [facility]  [address] [place] [address] [phone]  [facility] [address] [place] [address] [phone]  [facility] [address] [place] [address]  [place] [phone]

## 2018-07-08 NOTE — ED Provider Notes (Addendum)
TIME SEEN: 12:09 AM  CHIEF COMPLAINT: Left hand laceration  HPI: Patient is a 22 year old male who presents to the emergency department with left hand laceration that occurred just prior to arrival.  Patient is right-hand dominant.  States he was trying to push his mother's door closed when his hand went through the glass window in the door lacerating the thenar eminence.  Full range of motion in the left thumb.  No other injury.  States last tetanus vaccination was 1 year ago.  No numbness or tingling.  Denies that this was an intentional event.  ROS: See HPI Constitutional: no fever  Eyes: no drainage  ENT: no runny nose   Cardiovascular:  no chest pain  Resp: no SOB  GI: no vomiting GU: no dysuria Integumentary: no rash  Allergy: no hives  Musculoskeletal: no leg swelling  Neurological: no slurred speech ROS otherwise negative  PAST MEDICAL HISTORY/PAST SURGICAL HISTORY:  Past Medical History:  Diagnosis Date  . Asthma   . Heart murmur   . Psychiatric diagnosis     MEDICATIONS:  Prior to Admission medications   Medication Sig Start Date End Date Taking? Authorizing Provider  albuterol (PROVENTIL HFA;VENTOLIN HFA) 108 (90 BASE) MCG/ACT inhaler Inhale 2 puffs into the lungs every 6 (six) hours as needed for wheezing. Patient not taking: Reported on 11/10/2017 06/05/14   Narda BondsNettey, Ralph A, MD  albuterol (PROVENTIL,VENTOLIN) 90 MCG/ACT inhaler Inhale 2 puffs into the lungs every 4 (four) hours.    04/23/11  [provider]    ALLERGIES:  No Known Allergies  SOCIAL HISTORY:  Social History   Tobacco Use  . Smoking status: Never Smoker  . Smokeless tobacco: Never Used  Substance Use Topics  . Alcohol use: No    FAMILY HISTORY: No family history on file.  EXAM: BP 131/90 (BP Location: Right Arm)   Pulse 66   Temp 97.9 F (36.6 C) (Oral)   Resp 18   SpO2 100%  CONSTITUTIONAL: Alert and oriented and responds appropriately to questions. Well-appearing;  well-nourished HEAD: Normocephalic, atraumatic EYES: Conjunctivae clear, pupils appear equal ENT: normal nose; moist mucous membranes NECK: Supple CARD: RRR RESP: Normal chest excursion without splinting or tachypnea; no hypoxia or respiratory distress, speaking full sentences ABD/GI: non-distended BACK:  The back appears normal EXT: Normal ROM in all joints,  full range of motion all fingers of the left hand, 2+ left radial pulse, normal capillary refill, normal sensation throughout the left hand SKIN: Normal color for age and race; patient has a 5 cm superficial laceration to the left thenar eminence, no foreign body appreciated NEURO: Moves all extremities equally, normal gait, normal speech PSYCH: The patient's mood and manner are appropriate. Grooming and personal hygiene are appropriate.  MEDICAL DECISION MAKING: Patient here with left hand laceration.  No sign of tendon involvement.  Neurovascularly intact distally.  Tetanus vaccination is up-to-date.  Will repair laceration.  No sign of foreign body on examination.  Doubt fracture.  No other injury.  ED PROGRESS: Laceration repaired.  Discussed wound care instructions and return precautions.  Recommended alternating Tylenol and Motrin for pain.   LACERATION REPAIR Performed by: Rochele RaringKristen  Authorized by: Rochele RaringKristen  Consent: Verbal consent obtained. Risks and benefits: risks, benefits and alternatives were discussed Consent given by: patient Patient identity confirmed: provided demographic data Prepped and Draped in normal sterile fashion Wound explored  Laceration Location: Left hand  Laceration Length: 5 cm  No Foreign Bodies seen or palpated  Anesthesia: local infiltration  Local anesthetic: lidocaine 1 % without epinephrine  Anesthetic total: 5 ml  Irrigation method: syringe Amount of cleaning: standard  Skin closure: Simple  Number of sutures: 7  Technique:  Area anesthetized using lidocaine 1% without  epinephrine. Wound irrigated copiously with sterile saline. Wound then cleaned with Betadine and draped in sterile fashion. Wound closed using 7 simple interrupted sutures with 4-0 Prolene.  Bacitracin and sterile dressing applied. Good wound approximation and hemostasis achieved.   Patient tolerance: Patient tolerated the procedure well with no immediate complications.      At this time, I do not feel there is any life-threatening condition present. I have reviewed and discussed all results (EKG, imaging, lab, urine as appropriate) and exam findings with patient/family. I have reviewed nursing notes and appropriate previous records.  I feel the patient is safe to be discharged home without further emergent workup and can continue workup as an outpatient as needed. Discussed usual and customary return precautions. Patient/family verbalize understanding and are comfortable with this plan.  Outpatient follow-up has been provided as needed. All questions have been answered.      , Layla Maw, DO 07/08/18 0116    , Layla Maw, DO 07/08/18 0117

## 2020-12-08 ENCOUNTER — Ambulatory Visit (HOSPITAL_COMMUNITY): Payer: Medicaid Other | Admitting: Licensed Clinical Social Worker
